# Patient Record
Sex: Female | Born: 1945 | Race: White | Hispanic: No | Marital: Single
Health system: Southern US, Community
[De-identification: ages and names within clinical notes are randomized; demographics above are authoritative.]

---

## 2007-10-31 ENCOUNTER — Encounter: Admission: RE | Admit: 2007-10-31 | Discharge: 2007-10-31 | Payer: Self-pay | Admitting: Unknown Physician Specialty

## 2007-11-03 ENCOUNTER — Encounter: Admission: RE | Admit: 2007-11-03 | Discharge: 2007-11-03 | Payer: Self-pay | Admitting: Unknown Physician Specialty

## 2012-03-10 ENCOUNTER — Ambulatory Visit (INDEPENDENT_AMBULATORY_CARE_PROVIDER_SITE_OTHER): Payer: Medicare Other

## 2012-03-10 ENCOUNTER — Other Ambulatory Visit: Payer: Self-pay | Admitting: Family Medicine

## 2012-03-10 DIAGNOSIS — R109 Unspecified abdominal pain: Secondary | ICD-10-CM

## 2012-03-10 DIAGNOSIS — R197 Diarrhea, unspecified: Secondary | ICD-10-CM

## 2013-05-08 ENCOUNTER — Ambulatory Visit (INDEPENDENT_AMBULATORY_CARE_PROVIDER_SITE_OTHER): Payer: Medicare HMO

## 2013-05-08 ENCOUNTER — Other Ambulatory Visit: Payer: Self-pay | Admitting: Unknown Physician Specialty

## 2013-05-08 DIAGNOSIS — Z1231 Encounter for screening mammogram for malignant neoplasm of breast: Secondary | ICD-10-CM

## 2013-05-08 DIAGNOSIS — R059 Cough, unspecified: Secondary | ICD-10-CM

## 2013-05-08 DIAGNOSIS — R05 Cough: Secondary | ICD-10-CM

## 2013-05-08 DIAGNOSIS — Z780 Asymptomatic menopausal state: Secondary | ICD-10-CM

## 2013-05-08 DIAGNOSIS — M949 Disorder of cartilage, unspecified: Secondary | ICD-10-CM

## 2013-05-08 DIAGNOSIS — M899 Disorder of bone, unspecified: Secondary | ICD-10-CM

## 2013-05-08 DIAGNOSIS — Z139 Encounter for screening, unspecified: Secondary | ICD-10-CM

## 2013-05-08 DIAGNOSIS — Z87891 Personal history of nicotine dependence: Secondary | ICD-10-CM

## 2014-05-06 DIAGNOSIS — Z124 Encounter for screening for malignant neoplasm of cervix: Secondary | ICD-10-CM | POA: Diagnosis not present

## 2014-05-06 DIAGNOSIS — Z01419 Encounter for gynecological examination (general) (routine) without abnormal findings: Secondary | ICD-10-CM | POA: Diagnosis not present

## 2014-05-06 DIAGNOSIS — R87612 Low grade squamous intraepithelial lesion on cytologic smear of cervix (LGSIL): Secondary | ICD-10-CM | POA: Diagnosis not present

## 2014-05-06 DIAGNOSIS — Z1212 Encounter for screening for malignant neoplasm of rectum: Secondary | ICD-10-CM | POA: Diagnosis not present

## 2014-05-27 DIAGNOSIS — Z8679 Personal history of other diseases of the circulatory system: Secondary | ICD-10-CM | POA: Diagnosis not present

## 2014-05-27 DIAGNOSIS — R5383 Other fatigue: Secondary | ICD-10-CM | POA: Diagnosis not present

## 2014-05-27 DIAGNOSIS — I2510 Atherosclerotic heart disease of native coronary artery without angina pectoris: Secondary | ICD-10-CM | POA: Diagnosis not present

## 2014-05-27 DIAGNOSIS — Z23 Encounter for immunization: Secondary | ICD-10-CM | POA: Diagnosis not present

## 2014-05-27 DIAGNOSIS — E119 Type 2 diabetes mellitus without complications: Secondary | ICD-10-CM | POA: Diagnosis not present

## 2014-05-27 DIAGNOSIS — M8580 Other specified disorders of bone density and structure, unspecified site: Secondary | ICD-10-CM | POA: Diagnosis not present

## 2014-05-27 DIAGNOSIS — E780 Pure hypercholesterolemia: Secondary | ICD-10-CM | POA: Diagnosis not present

## 2014-05-27 DIAGNOSIS — M2550 Pain in unspecified joint: Secondary | ICD-10-CM | POA: Diagnosis not present

## 2014-06-05 DIAGNOSIS — I2510 Atherosclerotic heart disease of native coronary artery without angina pectoris: Secondary | ICD-10-CM | POA: Diagnosis not present

## 2014-06-05 DIAGNOSIS — E785 Hyperlipidemia, unspecified: Secondary | ICD-10-CM | POA: Diagnosis not present

## 2014-06-05 DIAGNOSIS — Z955 Presence of coronary angioplasty implant and graft: Secondary | ICD-10-CM | POA: Diagnosis not present

## 2014-06-05 DIAGNOSIS — I252 Old myocardial infarction: Secondary | ICD-10-CM | POA: Diagnosis not present

## 2014-06-05 DIAGNOSIS — I10 Essential (primary) hypertension: Secondary | ICD-10-CM | POA: Diagnosis not present

## 2014-10-03 DIAGNOSIS — I2510 Atherosclerotic heart disease of native coronary artery without angina pectoris: Secondary | ICD-10-CM | POA: Diagnosis not present

## 2014-10-03 DIAGNOSIS — E119 Type 2 diabetes mellitus without complications: Secondary | ICD-10-CM | POA: Diagnosis not present

## 2014-10-03 DIAGNOSIS — K219 Gastro-esophageal reflux disease without esophagitis: Secondary | ICD-10-CM | POA: Diagnosis not present

## 2014-10-03 DIAGNOSIS — E780 Pure hypercholesterolemia: Secondary | ICD-10-CM | POA: Diagnosis not present

## 2014-12-19 DIAGNOSIS — Z23 Encounter for immunization: Secondary | ICD-10-CM | POA: Diagnosis not present

## 2015-01-28 DIAGNOSIS — K219 Gastro-esophageal reflux disease without esophagitis: Secondary | ICD-10-CM | POA: Diagnosis not present

## 2015-01-28 DIAGNOSIS — E7800 Pure hypercholesterolemia, unspecified: Secondary | ICD-10-CM | POA: Diagnosis not present

## 2015-01-28 DIAGNOSIS — M8580 Other specified disorders of bone density and structure, unspecified site: Secondary | ICD-10-CM | POA: Diagnosis not present

## 2015-01-28 DIAGNOSIS — E119 Type 2 diabetes mellitus without complications: Secondary | ICD-10-CM | POA: Diagnosis not present

## 2015-04-01 DIAGNOSIS — E875 Hyperkalemia: Secondary | ICD-10-CM | POA: Diagnosis not present

## 2015-04-29 DIAGNOSIS — H25813 Combined forms of age-related cataract, bilateral: Secondary | ICD-10-CM | POA: Diagnosis not present

## 2015-04-29 DIAGNOSIS — H524 Presbyopia: Secondary | ICD-10-CM | POA: Diagnosis not present

## 2015-04-29 DIAGNOSIS — H5210 Myopia, unspecified eye: Secondary | ICD-10-CM | POA: Diagnosis not present

## 2015-05-08 DIAGNOSIS — Z6833 Body mass index (BMI) 33.0-33.9, adult: Secondary | ICD-10-CM | POA: Diagnosis not present

## 2015-05-08 DIAGNOSIS — Z01419 Encounter for gynecological examination (general) (routine) without abnormal findings: Secondary | ICD-10-CM | POA: Diagnosis not present

## 2015-05-26 DIAGNOSIS — H25813 Combined forms of age-related cataract, bilateral: Secondary | ICD-10-CM | POA: Diagnosis not present

## 2015-05-26 DIAGNOSIS — H02834 Dermatochalasis of left upper eyelid: Secondary | ICD-10-CM | POA: Diagnosis not present

## 2015-05-26 DIAGNOSIS — H43812 Vitreous degeneration, left eye: Secondary | ICD-10-CM | POA: Diagnosis not present

## 2015-05-26 DIAGNOSIS — H35371 Puckering of macula, right eye: Secondary | ICD-10-CM | POA: Diagnosis not present

## 2015-05-26 DIAGNOSIS — H527 Unspecified disorder of refraction: Secondary | ICD-10-CM | POA: Diagnosis not present

## 2015-05-26 DIAGNOSIS — H02831 Dermatochalasis of right upper eyelid: Secondary | ICD-10-CM | POA: Diagnosis not present

## 2015-05-27 DIAGNOSIS — Z0000 Encounter for general adult medical examination without abnormal findings: Secondary | ICD-10-CM | POA: Diagnosis not present

## 2015-05-27 DIAGNOSIS — M8580 Other specified disorders of bone density and structure, unspecified site: Secondary | ICD-10-CM | POA: Diagnosis not present

## 2015-05-27 DIAGNOSIS — I10 Essential (primary) hypertension: Secondary | ICD-10-CM | POA: Diagnosis not present

## 2015-05-27 DIAGNOSIS — I2510 Atherosclerotic heart disease of native coronary artery without angina pectoris: Secondary | ICD-10-CM | POA: Diagnosis not present

## 2015-05-27 DIAGNOSIS — M899 Disorder of bone, unspecified: Secondary | ICD-10-CM | POA: Diagnosis not present

## 2015-05-27 DIAGNOSIS — E119 Type 2 diabetes mellitus without complications: Secondary | ICD-10-CM | POA: Diagnosis not present

## 2015-05-27 DIAGNOSIS — H9190 Unspecified hearing loss, unspecified ear: Secondary | ICD-10-CM | POA: Diagnosis not present

## 2015-05-27 DIAGNOSIS — E7800 Pure hypercholesterolemia, unspecified: Secondary | ICD-10-CM | POA: Diagnosis not present

## 2015-05-27 DIAGNOSIS — N393 Stress incontinence (female) (male): Secondary | ICD-10-CM | POA: Diagnosis not present

## 2015-05-29 ENCOUNTER — Other Ambulatory Visit: Payer: Self-pay | Admitting: Unknown Physician Specialty

## 2015-05-29 DIAGNOSIS — Z0000 Encounter for general adult medical examination without abnormal findings: Secondary | ICD-10-CM

## 2015-06-04 ENCOUNTER — Ambulatory Visit (INDEPENDENT_AMBULATORY_CARE_PROVIDER_SITE_OTHER): Payer: Commercial Managed Care - HMO

## 2015-06-04 DIAGNOSIS — M85859 Other specified disorders of bone density and structure, unspecified thigh: Secondary | ICD-10-CM

## 2015-06-04 DIAGNOSIS — M8589 Other specified disorders of bone density and structure, multiple sites: Secondary | ICD-10-CM | POA: Diagnosis not present

## 2015-06-04 DIAGNOSIS — Z0000 Encounter for general adult medical examination without abnormal findings: Secondary | ICD-10-CM

## 2015-06-27 DIAGNOSIS — H52223 Regular astigmatism, bilateral: Secondary | ICD-10-CM | POA: Diagnosis not present

## 2015-06-27 DIAGNOSIS — H02831 Dermatochalasis of right upper eyelid: Secondary | ICD-10-CM | POA: Diagnosis not present

## 2015-06-27 DIAGNOSIS — H25813 Combined forms of age-related cataract, bilateral: Secondary | ICD-10-CM | POA: Diagnosis not present

## 2015-06-27 DIAGNOSIS — H02834 Dermatochalasis of left upper eyelid: Secondary | ICD-10-CM | POA: Diagnosis not present

## 2015-06-27 DIAGNOSIS — H527 Unspecified disorder of refraction: Secondary | ICD-10-CM | POA: Diagnosis not present

## 2015-06-27 DIAGNOSIS — H43812 Vitreous degeneration, left eye: Secondary | ICD-10-CM | POA: Diagnosis not present

## 2015-06-27 DIAGNOSIS — H35371 Puckering of macula, right eye: Secondary | ICD-10-CM | POA: Diagnosis not present

## 2015-06-28 DIAGNOSIS — E785 Hyperlipidemia, unspecified: Secondary | ICD-10-CM | POA: Diagnosis not present

## 2015-06-28 DIAGNOSIS — S8991XA Unspecified injury of right lower leg, initial encounter: Secondary | ICD-10-CM | POA: Diagnosis not present

## 2015-06-28 DIAGNOSIS — W010XXA Fall on same level from slipping, tripping and stumbling without subsequent striking against object, initial encounter: Secondary | ICD-10-CM | POA: Diagnosis not present

## 2015-06-28 DIAGNOSIS — T148 Other injury of unspecified body region: Secondary | ICD-10-CM | POA: Diagnosis not present

## 2015-06-28 DIAGNOSIS — F17210 Nicotine dependence, cigarettes, uncomplicated: Secondary | ICD-10-CM | POA: Diagnosis not present

## 2015-06-28 DIAGNOSIS — E119 Type 2 diabetes mellitus without complications: Secondary | ICD-10-CM | POA: Diagnosis not present

## 2015-06-28 DIAGNOSIS — S50811A Abrasion of right forearm, initial encounter: Secondary | ICD-10-CM | POA: Diagnosis not present

## 2015-06-28 DIAGNOSIS — S8011XA Contusion of right lower leg, initial encounter: Secondary | ICD-10-CM | POA: Diagnosis not present

## 2015-06-28 DIAGNOSIS — I2510 Atherosclerotic heart disease of native coronary artery without angina pectoris: Secondary | ICD-10-CM | POA: Diagnosis not present

## 2015-06-28 DIAGNOSIS — Z9861 Coronary angioplasty status: Secondary | ICD-10-CM | POA: Diagnosis not present

## 2015-06-28 DIAGNOSIS — Z86718 Personal history of other venous thrombosis and embolism: Secondary | ICD-10-CM | POA: Diagnosis not present

## 2015-06-28 DIAGNOSIS — I252 Old myocardial infarction: Secondary | ICD-10-CM | POA: Diagnosis not present

## 2015-06-28 DIAGNOSIS — M542 Cervicalgia: Secondary | ICD-10-CM | POA: Diagnosis not present

## 2015-07-03 DIAGNOSIS — H25811 Combined forms of age-related cataract, right eye: Secondary | ICD-10-CM | POA: Diagnosis not present

## 2015-07-03 DIAGNOSIS — H02834 Dermatochalasis of left upper eyelid: Secondary | ICD-10-CM | POA: Diagnosis not present

## 2015-07-03 DIAGNOSIS — H2181 Floppy iris syndrome: Secondary | ICD-10-CM | POA: Diagnosis not present

## 2015-07-03 DIAGNOSIS — H35371 Puckering of macula, right eye: Secondary | ICD-10-CM | POA: Diagnosis not present

## 2015-07-03 DIAGNOSIS — E1136 Type 2 diabetes mellitus with diabetic cataract: Secondary | ICD-10-CM | POA: Diagnosis not present

## 2015-07-03 DIAGNOSIS — H43812 Vitreous degeneration, left eye: Secondary | ICD-10-CM | POA: Diagnosis not present

## 2015-07-03 DIAGNOSIS — H25813 Combined forms of age-related cataract, bilateral: Secondary | ICD-10-CM | POA: Diagnosis not present

## 2015-07-03 DIAGNOSIS — H527 Unspecified disorder of refraction: Secondary | ICD-10-CM | POA: Diagnosis not present

## 2015-07-03 DIAGNOSIS — H52223 Regular astigmatism, bilateral: Secondary | ICD-10-CM | POA: Diagnosis not present

## 2015-07-03 DIAGNOSIS — H02831 Dermatochalasis of right upper eyelid: Secondary | ICD-10-CM | POA: Diagnosis not present

## 2015-07-08 DIAGNOSIS — K621 Rectal polyp: Secondary | ICD-10-CM | POA: Diagnosis not present

## 2015-07-08 DIAGNOSIS — Z1211 Encounter for screening for malignant neoplasm of colon: Secondary | ICD-10-CM | POA: Diagnosis not present

## 2015-07-08 DIAGNOSIS — D125 Benign neoplasm of sigmoid colon: Secondary | ICD-10-CM | POA: Diagnosis not present

## 2015-07-16 DIAGNOSIS — J069 Acute upper respiratory infection, unspecified: Secondary | ICD-10-CM | POA: Diagnosis not present

## 2015-07-16 DIAGNOSIS — R0789 Other chest pain: Secondary | ICD-10-CM | POA: Diagnosis not present

## 2015-07-16 DIAGNOSIS — J9801 Acute bronchospasm: Secondary | ICD-10-CM | POA: Diagnosis not present

## 2015-07-16 DIAGNOSIS — R0989 Other specified symptoms and signs involving the circulatory and respiratory systems: Secondary | ICD-10-CM | POA: Diagnosis not present

## 2015-07-17 DIAGNOSIS — I252 Old myocardial infarction: Secondary | ICD-10-CM | POA: Diagnosis not present

## 2015-07-17 DIAGNOSIS — Z720 Tobacco use: Secondary | ICD-10-CM | POA: Diagnosis not present

## 2015-07-17 DIAGNOSIS — I10 Essential (primary) hypertension: Secondary | ICD-10-CM | POA: Diagnosis not present

## 2015-07-17 DIAGNOSIS — E782 Mixed hyperlipidemia: Secondary | ICD-10-CM | POA: Diagnosis not present

## 2015-09-04 DIAGNOSIS — E785 Hyperlipidemia, unspecified: Secondary | ICD-10-CM | POA: Diagnosis not present

## 2015-09-04 DIAGNOSIS — H52222 Regular astigmatism, left eye: Secondary | ICD-10-CM | POA: Diagnosis not present

## 2015-09-04 DIAGNOSIS — I252 Old myocardial infarction: Secondary | ICD-10-CM | POA: Diagnosis not present

## 2015-09-04 DIAGNOSIS — K219 Gastro-esophageal reflux disease without esophagitis: Secondary | ICD-10-CM | POA: Diagnosis not present

## 2015-09-04 DIAGNOSIS — Z955 Presence of coronary angioplasty implant and graft: Secondary | ICD-10-CM | POA: Diagnosis not present

## 2015-09-04 DIAGNOSIS — I2510 Atherosclerotic heart disease of native coronary artery without angina pectoris: Secondary | ICD-10-CM | POA: Diagnosis not present

## 2015-09-04 DIAGNOSIS — H25811 Combined forms of age-related cataract, right eye: Secondary | ICD-10-CM | POA: Diagnosis not present

## 2015-09-04 DIAGNOSIS — E1136 Type 2 diabetes mellitus with diabetic cataract: Secondary | ICD-10-CM | POA: Diagnosis not present

## 2015-09-04 DIAGNOSIS — H25812 Combined forms of age-related cataract, left eye: Secondary | ICD-10-CM | POA: Diagnosis not present

## 2015-09-04 DIAGNOSIS — E669 Obesity, unspecified: Secondary | ICD-10-CM | POA: Diagnosis not present

## 2015-09-24 DIAGNOSIS — M25561 Pain in right knee: Secondary | ICD-10-CM | POA: Diagnosis not present

## 2015-09-24 DIAGNOSIS — M170 Bilateral primary osteoarthritis of knee: Secondary | ICD-10-CM | POA: Diagnosis not present

## 2015-09-24 DIAGNOSIS — M25562 Pain in left knee: Secondary | ICD-10-CM | POA: Diagnosis not present

## 2015-10-09 DIAGNOSIS — E119 Type 2 diabetes mellitus without complications: Secondary | ICD-10-CM | POA: Diagnosis not present

## 2015-10-09 DIAGNOSIS — E7800 Pure hypercholesterolemia, unspecified: Secondary | ICD-10-CM | POA: Diagnosis not present

## 2015-10-09 DIAGNOSIS — M8580 Other specified disorders of bone density and structure, unspecified site: Secondary | ICD-10-CM | POA: Diagnosis not present

## 2015-10-09 DIAGNOSIS — K219 Gastro-esophageal reflux disease without esophagitis: Secondary | ICD-10-CM | POA: Diagnosis not present

## 2015-10-22 DIAGNOSIS — M25562 Pain in left knee: Secondary | ICD-10-CM | POA: Diagnosis not present

## 2015-10-22 DIAGNOSIS — M25561 Pain in right knee: Secondary | ICD-10-CM | POA: Diagnosis not present

## 2015-10-22 DIAGNOSIS — M170 Bilateral primary osteoarthritis of knee: Secondary | ICD-10-CM | POA: Diagnosis not present

## 2015-11-17 DIAGNOSIS — Z23 Encounter for immunization: Secondary | ICD-10-CM | POA: Diagnosis not present

## 2016-02-13 DIAGNOSIS — M170 Bilateral primary osteoarthritis of knee: Secondary | ICD-10-CM | POA: Diagnosis not present

## 2016-02-13 DIAGNOSIS — M25561 Pain in right knee: Secondary | ICD-10-CM | POA: Diagnosis not present

## 2016-02-13 DIAGNOSIS — M25562 Pain in left knee: Secondary | ICD-10-CM | POA: Diagnosis not present

## 2016-02-19 DIAGNOSIS — E7800 Pure hypercholesterolemia, unspecified: Secondary | ICD-10-CM | POA: Diagnosis not present

## 2016-02-19 DIAGNOSIS — K219 Gastro-esophageal reflux disease without esophagitis: Secondary | ICD-10-CM | POA: Diagnosis not present

## 2016-02-19 DIAGNOSIS — E119 Type 2 diabetes mellitus without complications: Secondary | ICD-10-CM | POA: Diagnosis not present

## 2016-02-19 DIAGNOSIS — M170 Bilateral primary osteoarthritis of knee: Secondary | ICD-10-CM | POA: Diagnosis not present

## 2016-03-16 DIAGNOSIS — J9801 Acute bronchospasm: Secondary | ICD-10-CM | POA: Diagnosis not present

## 2016-03-16 DIAGNOSIS — J069 Acute upper respiratory infection, unspecified: Secondary | ICD-10-CM | POA: Diagnosis not present

## 2016-05-12 DIAGNOSIS — M25561 Pain in right knee: Secondary | ICD-10-CM | POA: Diagnosis not present

## 2016-05-12 DIAGNOSIS — M25562 Pain in left knee: Secondary | ICD-10-CM | POA: Diagnosis not present

## 2016-06-18 DIAGNOSIS — H5210 Myopia, unspecified eye: Secondary | ICD-10-CM | POA: Diagnosis not present

## 2016-06-18 DIAGNOSIS — E109 Type 1 diabetes mellitus without complications: Secondary | ICD-10-CM | POA: Diagnosis not present

## 2016-06-18 DIAGNOSIS — E7800 Pure hypercholesterolemia, unspecified: Secondary | ICD-10-CM | POA: Diagnosis not present

## 2016-06-28 DIAGNOSIS — I2510 Atherosclerotic heart disease of native coronary artery without angina pectoris: Secondary | ICD-10-CM | POA: Diagnosis not present

## 2016-06-28 DIAGNOSIS — E7800 Pure hypercholesterolemia, unspecified: Secondary | ICD-10-CM | POA: Diagnosis not present

## 2016-06-28 DIAGNOSIS — Z0000 Encounter for general adult medical examination without abnormal findings: Secondary | ICD-10-CM | POA: Diagnosis not present

## 2016-06-28 DIAGNOSIS — F339 Major depressive disorder, recurrent, unspecified: Secondary | ICD-10-CM | POA: Diagnosis not present

## 2016-06-28 DIAGNOSIS — E119 Type 2 diabetes mellitus without complications: Secondary | ICD-10-CM | POA: Diagnosis not present

## 2016-06-29 DIAGNOSIS — R635 Abnormal weight gain: Secondary | ICD-10-CM | POA: Diagnosis not present

## 2016-06-29 DIAGNOSIS — E7800 Pure hypercholesterolemia, unspecified: Secondary | ICD-10-CM | POA: Diagnosis not present

## 2016-06-29 DIAGNOSIS — E119 Type 2 diabetes mellitus without complications: Secondary | ICD-10-CM | POA: Diagnosis not present

## 2016-06-29 DIAGNOSIS — M8580 Other specified disorders of bone density and structure, unspecified site: Secondary | ICD-10-CM | POA: Diagnosis not present

## 2016-06-29 DIAGNOSIS — R5383 Other fatigue: Secondary | ICD-10-CM | POA: Diagnosis not present

## 2016-06-29 DIAGNOSIS — M899 Disorder of bone, unspecified: Secondary | ICD-10-CM | POA: Diagnosis not present

## 2016-07-19 DIAGNOSIS — I252 Old myocardial infarction: Secondary | ICD-10-CM | POA: Diagnosis not present

## 2016-07-19 DIAGNOSIS — I2510 Atherosclerotic heart disease of native coronary artery without angina pectoris: Secondary | ICD-10-CM | POA: Diagnosis not present

## 2016-07-19 DIAGNOSIS — E785 Hyperlipidemia, unspecified: Secondary | ICD-10-CM | POA: Diagnosis not present

## 2016-07-19 DIAGNOSIS — I10 Essential (primary) hypertension: Secondary | ICD-10-CM | POA: Diagnosis not present

## 2016-07-19 DIAGNOSIS — Z955 Presence of coronary angioplasty implant and graft: Secondary | ICD-10-CM | POA: Diagnosis not present

## 2016-07-20 DIAGNOSIS — L719 Rosacea, unspecified: Secondary | ICD-10-CM | POA: Diagnosis not present

## 2016-07-30 DIAGNOSIS — M25562 Pain in left knee: Secondary | ICD-10-CM | POA: Diagnosis not present

## 2016-07-30 DIAGNOSIS — M25561 Pain in right knee: Secondary | ICD-10-CM | POA: Diagnosis not present

## 2016-08-26 DIAGNOSIS — E119 Type 2 diabetes mellitus without complications: Secondary | ICD-10-CM | POA: Diagnosis not present

## 2016-08-26 DIAGNOSIS — R5383 Other fatigue: Secondary | ICD-10-CM | POA: Diagnosis not present

## 2016-08-26 DIAGNOSIS — I252 Old myocardial infarction: Secondary | ICD-10-CM | POA: Diagnosis not present

## 2016-08-26 DIAGNOSIS — I2510 Atherosclerotic heart disease of native coronary artery without angina pectoris: Secondary | ICD-10-CM | POA: Diagnosis not present

## 2016-08-31 DIAGNOSIS — L719 Rosacea, unspecified: Secondary | ICD-10-CM | POA: Diagnosis not present

## 2016-11-02 DIAGNOSIS — E785 Hyperlipidemia, unspecified: Secondary | ICD-10-CM | POA: Diagnosis not present

## 2016-11-02 DIAGNOSIS — I2510 Atherosclerotic heart disease of native coronary artery without angina pectoris: Secondary | ICD-10-CM | POA: Diagnosis not present

## 2016-11-02 DIAGNOSIS — E119 Type 2 diabetes mellitus without complications: Secondary | ICD-10-CM | POA: Diagnosis not present

## 2016-11-02 DIAGNOSIS — Z23 Encounter for immunization: Secondary | ICD-10-CM | POA: Diagnosis not present

## 2016-11-02 DIAGNOSIS — K219 Gastro-esophageal reflux disease without esophagitis: Secondary | ICD-10-CM | POA: Diagnosis not present

## 2016-12-20 DIAGNOSIS — E119 Type 2 diabetes mellitus without complications: Secondary | ICD-10-CM | POA: Diagnosis not present

## 2016-12-20 DIAGNOSIS — R209 Unspecified disturbances of skin sensation: Secondary | ICD-10-CM | POA: Diagnosis not present

## 2016-12-20 DIAGNOSIS — G5603 Carpal tunnel syndrome, bilateral upper limbs: Secondary | ICD-10-CM | POA: Diagnosis not present

## 2016-12-20 DIAGNOSIS — M25512 Pain in left shoulder: Secondary | ICD-10-CM | POA: Diagnosis not present

## 2017-03-03 DIAGNOSIS — E119 Type 2 diabetes mellitus without complications: Secondary | ICD-10-CM | POA: Diagnosis not present

## 2017-03-03 DIAGNOSIS — F339 Major depressive disorder, recurrent, unspecified: Secondary | ICD-10-CM | POA: Diagnosis not present

## 2017-03-03 DIAGNOSIS — E785 Hyperlipidemia, unspecified: Secondary | ICD-10-CM | POA: Diagnosis not present

## 2017-03-03 DIAGNOSIS — K219 Gastro-esophageal reflux disease without esophagitis: Secondary | ICD-10-CM | POA: Diagnosis not present

## 2017-03-09 DIAGNOSIS — M25562 Pain in left knee: Secondary | ICD-10-CM | POA: Diagnosis not present

## 2017-03-09 DIAGNOSIS — M25561 Pain in right knee: Secondary | ICD-10-CM | POA: Diagnosis not present

## 2017-03-09 DIAGNOSIS — M170 Bilateral primary osteoarthritis of knee: Secondary | ICD-10-CM | POA: Diagnosis not present

## 2017-03-17 DIAGNOSIS — Z01419 Encounter for gynecological examination (general) (routine) without abnormal findings: Secondary | ICD-10-CM | POA: Diagnosis not present

## 2017-03-17 DIAGNOSIS — Z87410 Personal history of cervical dysplasia: Secondary | ICD-10-CM | POA: Diagnosis not present

## 2017-03-24 DIAGNOSIS — R1900 Intra-abdominal and pelvic swelling, mass and lump, unspecified site: Secondary | ICD-10-CM | POA: Diagnosis not present

## 2017-05-06 ENCOUNTER — Other Ambulatory Visit: Payer: Self-pay | Admitting: Unknown Physician Specialty

## 2017-05-06 DIAGNOSIS — Z1239 Encounter for other screening for malignant neoplasm of breast: Secondary | ICD-10-CM

## 2017-05-11 ENCOUNTER — Ambulatory Visit: Payer: Self-pay

## 2017-05-12 ENCOUNTER — Ambulatory Visit (INDEPENDENT_AMBULATORY_CARE_PROVIDER_SITE_OTHER): Payer: Medicare HMO

## 2017-05-12 DIAGNOSIS — Z1231 Encounter for screening mammogram for malignant neoplasm of breast: Secondary | ICD-10-CM | POA: Diagnosis not present

## 2017-05-12 DIAGNOSIS — Z1239 Encounter for other screening for malignant neoplasm of breast: Secondary | ICD-10-CM

## 2017-06-10 DIAGNOSIS — E118 Type 2 diabetes mellitus with unspecified complications: Secondary | ICD-10-CM | POA: Diagnosis not present

## 2017-06-10 DIAGNOSIS — G4489 Other headache syndrome: Secondary | ICD-10-CM | POA: Diagnosis not present

## 2017-06-10 DIAGNOSIS — F17210 Nicotine dependence, cigarettes, uncomplicated: Secondary | ICD-10-CM | POA: Diagnosis not present

## 2017-06-10 DIAGNOSIS — R404 Transient alteration of awareness: Secondary | ICD-10-CM | POA: Diagnosis not present

## 2017-06-10 DIAGNOSIS — R5383 Other fatigue: Secondary | ICD-10-CM | POA: Diagnosis not present

## 2017-06-10 DIAGNOSIS — Z955 Presence of coronary angioplasty implant and graft: Secondary | ICD-10-CM | POA: Diagnosis not present

## 2017-06-10 DIAGNOSIS — R0789 Other chest pain: Secondary | ICD-10-CM | POA: Diagnosis not present

## 2017-06-10 DIAGNOSIS — I2510 Atherosclerotic heart disease of native coronary artery without angina pectoris: Secondary | ICD-10-CM | POA: Diagnosis not present

## 2017-06-10 DIAGNOSIS — E119 Type 2 diabetes mellitus without complications: Secondary | ICD-10-CM | POA: Diagnosis not present

## 2017-06-10 DIAGNOSIS — B349 Viral infection, unspecified: Secondary | ICD-10-CM | POA: Diagnosis not present

## 2017-06-10 DIAGNOSIS — R51 Headache: Secondary | ICD-10-CM | POA: Diagnosis not present

## 2017-06-10 DIAGNOSIS — R079 Chest pain, unspecified: Secondary | ICD-10-CM | POA: Diagnosis not present

## 2017-06-10 DIAGNOSIS — R531 Weakness: Secondary | ICD-10-CM | POA: Diagnosis not present

## 2017-06-10 DIAGNOSIS — R748 Abnormal levels of other serum enzymes: Secondary | ICD-10-CM | POA: Diagnosis not present

## 2017-06-10 DIAGNOSIS — I10 Essential (primary) hypertension: Secondary | ICD-10-CM | POA: Diagnosis not present

## 2017-06-11 DIAGNOSIS — I2510 Atherosclerotic heart disease of native coronary artery without angina pectoris: Secondary | ICD-10-CM | POA: Diagnosis not present

## 2017-06-11 DIAGNOSIS — R748 Abnormal levels of other serum enzymes: Secondary | ICD-10-CM | POA: Diagnosis not present

## 2017-06-11 DIAGNOSIS — B349 Viral infection, unspecified: Secondary | ICD-10-CM | POA: Diagnosis not present

## 2017-06-11 DIAGNOSIS — R51 Headache: Secondary | ICD-10-CM | POA: Diagnosis not present

## 2017-06-29 ENCOUNTER — Ambulatory Visit (INDEPENDENT_AMBULATORY_CARE_PROVIDER_SITE_OTHER): Payer: Medicare HMO

## 2017-06-29 ENCOUNTER — Other Ambulatory Visit: Payer: Self-pay | Admitting: Unknown Physician Specialty

## 2017-06-29 DIAGNOSIS — F339 Major depressive disorder, recurrent, unspecified: Secondary | ICD-10-CM | POA: Diagnosis not present

## 2017-06-29 DIAGNOSIS — R197 Diarrhea, unspecified: Secondary | ICD-10-CM

## 2017-06-29 DIAGNOSIS — R109 Unspecified abdominal pain: Secondary | ICD-10-CM | POA: Diagnosis not present

## 2017-06-29 DIAGNOSIS — R5383 Other fatigue: Secondary | ICD-10-CM | POA: Diagnosis not present

## 2017-06-29 DIAGNOSIS — E119 Type 2 diabetes mellitus without complications: Secondary | ICD-10-CM | POA: Diagnosis not present

## 2017-06-29 DIAGNOSIS — E785 Hyperlipidemia, unspecified: Secondary | ICD-10-CM | POA: Diagnosis not present

## 2017-06-29 DIAGNOSIS — M8580 Other specified disorders of bone density and structure, unspecified site: Secondary | ICD-10-CM | POA: Diagnosis not present

## 2017-06-29 DIAGNOSIS — Z0000 Encounter for general adult medical examination without abnormal findings: Secondary | ICD-10-CM | POA: Diagnosis not present

## 2017-06-29 DIAGNOSIS — I2510 Atherosclerotic heart disease of native coronary artery without angina pectoris: Secondary | ICD-10-CM | POA: Diagnosis not present

## 2017-06-29 DIAGNOSIS — M899 Disorder of bone, unspecified: Secondary | ICD-10-CM | POA: Diagnosis not present

## 2017-07-05 ENCOUNTER — Other Ambulatory Visit: Payer: Self-pay | Admitting: Unknown Physician Specialty

## 2017-07-05 DIAGNOSIS — Z780 Asymptomatic menopausal state: Secondary | ICD-10-CM

## 2017-07-29 DIAGNOSIS — R1030 Lower abdominal pain, unspecified: Secondary | ICD-10-CM | POA: Diagnosis not present

## 2017-07-29 DIAGNOSIS — R197 Diarrhea, unspecified: Secondary | ICD-10-CM | POA: Diagnosis not present

## 2017-11-07 DIAGNOSIS — M8580 Other specified disorders of bone density and structure, unspecified site: Secondary | ICD-10-CM | POA: Diagnosis not present

## 2017-11-07 DIAGNOSIS — E785 Hyperlipidemia, unspecified: Secondary | ICD-10-CM | POA: Diagnosis not present

## 2017-11-07 DIAGNOSIS — E1169 Type 2 diabetes mellitus with other specified complication: Secondary | ICD-10-CM | POA: Diagnosis not present

## 2017-11-07 DIAGNOSIS — Z23 Encounter for immunization: Secondary | ICD-10-CM | POA: Diagnosis not present

## 2017-11-07 DIAGNOSIS — F339 Major depressive disorder, recurrent, unspecified: Secondary | ICD-10-CM | POA: Diagnosis not present

## 2017-11-09 DIAGNOSIS — R1084 Generalized abdominal pain: Secondary | ICD-10-CM | POA: Diagnosis not present

## 2017-11-09 DIAGNOSIS — M1712 Unilateral primary osteoarthritis, left knee: Secondary | ICD-10-CM | POA: Diagnosis not present

## 2017-11-09 DIAGNOSIS — R110 Nausea: Secondary | ICD-10-CM | POA: Diagnosis not present

## 2017-11-09 DIAGNOSIS — M1711 Unilateral primary osteoarthritis, right knee: Secondary | ICD-10-CM | POA: Diagnosis not present

## 2017-11-09 DIAGNOSIS — M25561 Pain in right knee: Secondary | ICD-10-CM | POA: Diagnosis not present

## 2017-11-09 DIAGNOSIS — M25562 Pain in left knee: Secondary | ICD-10-CM | POA: Diagnosis not present

## 2017-11-09 DIAGNOSIS — R140 Abdominal distension (gaseous): Secondary | ICD-10-CM | POA: Diagnosis not present

## 2017-11-10 DIAGNOSIS — I878 Other specified disorders of veins: Secondary | ICD-10-CM | POA: Diagnosis not present

## 2017-11-10 DIAGNOSIS — R140 Abdominal distension (gaseous): Secondary | ICD-10-CM | POA: Diagnosis not present

## 2017-11-10 DIAGNOSIS — K5900 Constipation, unspecified: Secondary | ICD-10-CM | POA: Diagnosis not present

## 2017-11-10 DIAGNOSIS — R109 Unspecified abdominal pain: Secondary | ICD-10-CM | POA: Diagnosis not present

## 2018-01-05 DIAGNOSIS — R140 Abdominal distension (gaseous): Secondary | ICD-10-CM | POA: Diagnosis not present

## 2018-01-05 DIAGNOSIS — R197 Diarrhea, unspecified: Secondary | ICD-10-CM | POA: Diagnosis not present

## 2018-08-30 ENCOUNTER — Other Ambulatory Visit: Payer: Self-pay | Admitting: Unknown Physician Specialty

## 2018-08-30 DIAGNOSIS — Z1231 Encounter for screening mammogram for malignant neoplasm of breast: Secondary | ICD-10-CM

## 2018-10-11 ENCOUNTER — Ambulatory Visit: Payer: Medicare HMO

## 2018-10-25 ENCOUNTER — Ambulatory Visit (INDEPENDENT_AMBULATORY_CARE_PROVIDER_SITE_OTHER): Payer: Medicare Other

## 2018-10-25 ENCOUNTER — Other Ambulatory Visit: Payer: Self-pay

## 2018-10-25 DIAGNOSIS — Z1231 Encounter for screening mammogram for malignant neoplasm of breast: Secondary | ICD-10-CM | POA: Diagnosis not present

## 2019-11-25 IMAGING — DX DG ABDOMEN 2V
3 series · 3 of 3 positions shown · non-contrast
Comparison: None.

ADDENDUM:
This addendum is given for the purpose of noting patient has a
moderate volume of stool in the ascending colon. Stool burden is
otherwise unremarkable.
CLINICAL DATA: Diarrhea for 2 weeks.

EXAM:
ABDOMEN - 2 VIEW

[abdomen erect]
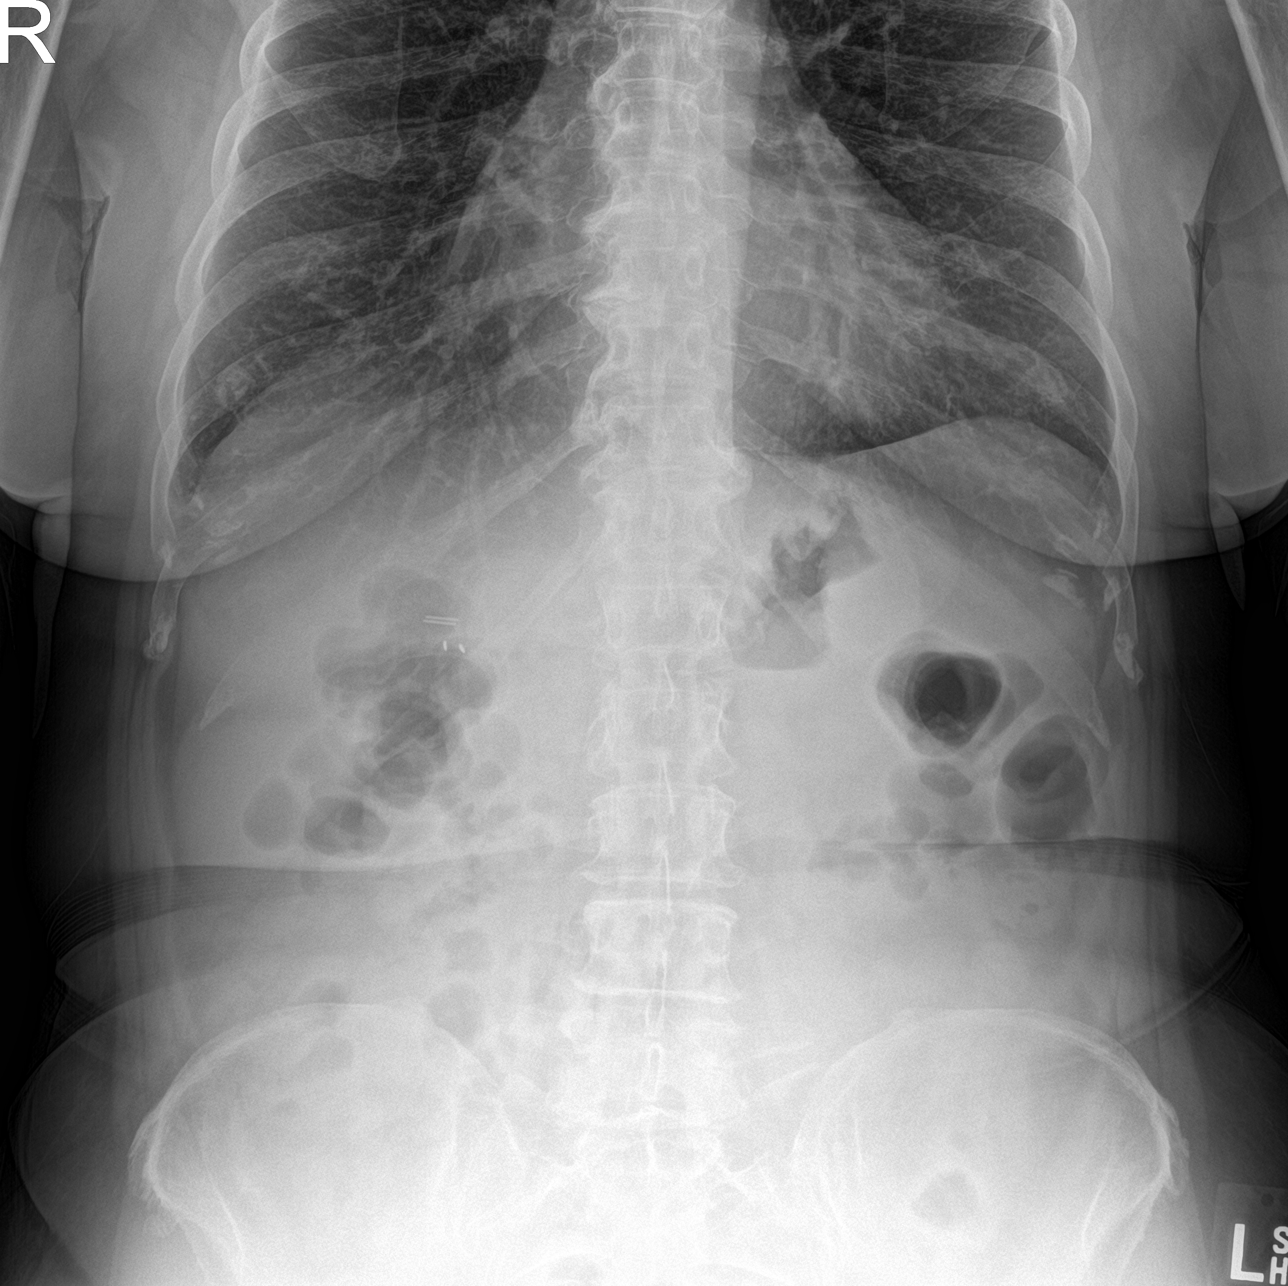

[abdomen supine (1 of 2)]
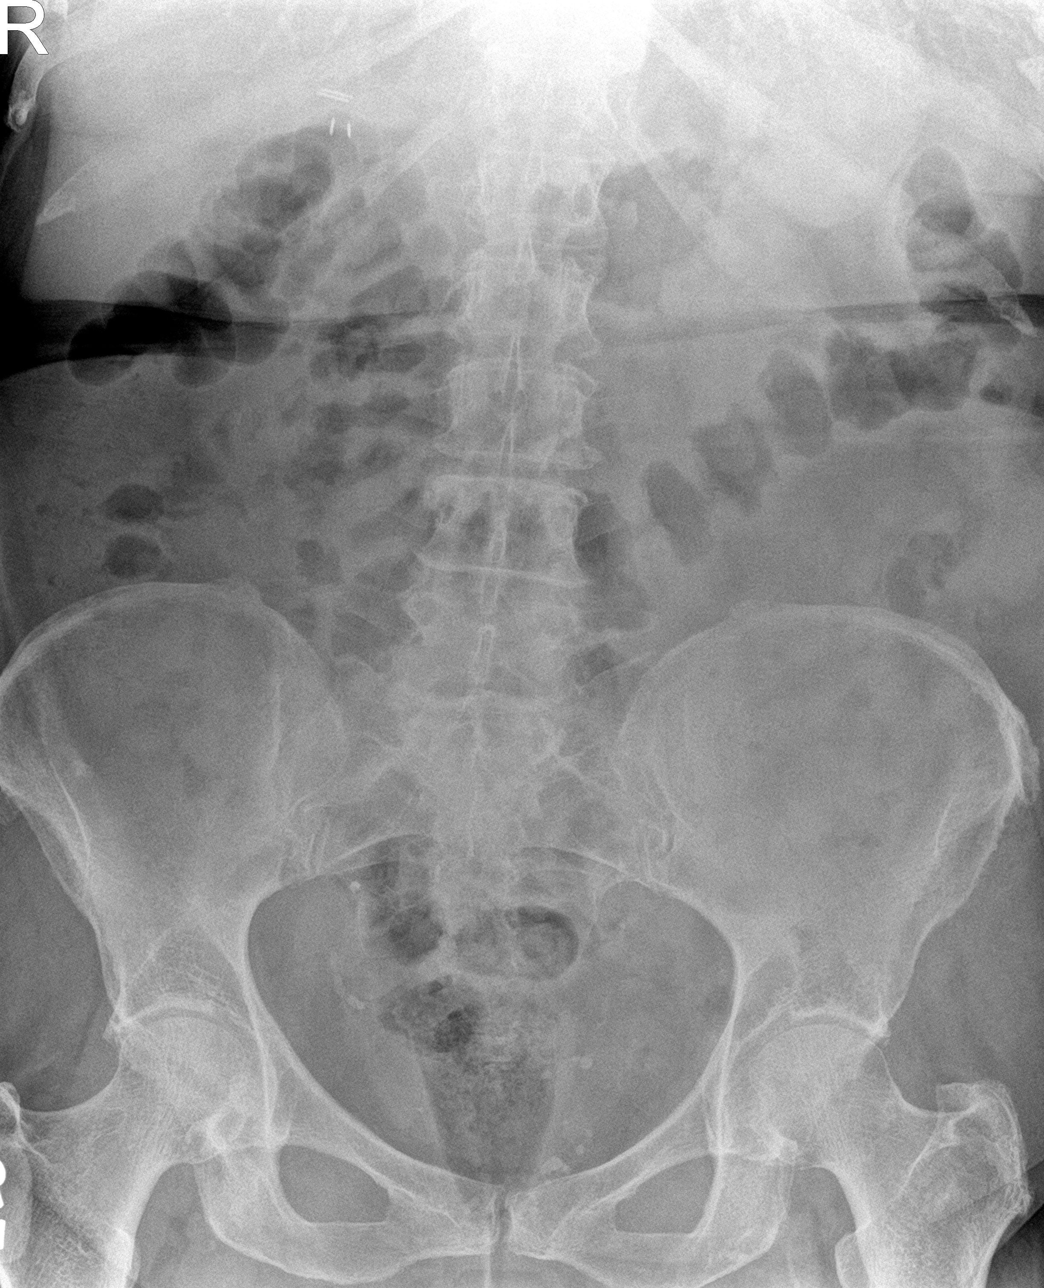

[abdomen supine (2 of 2)]
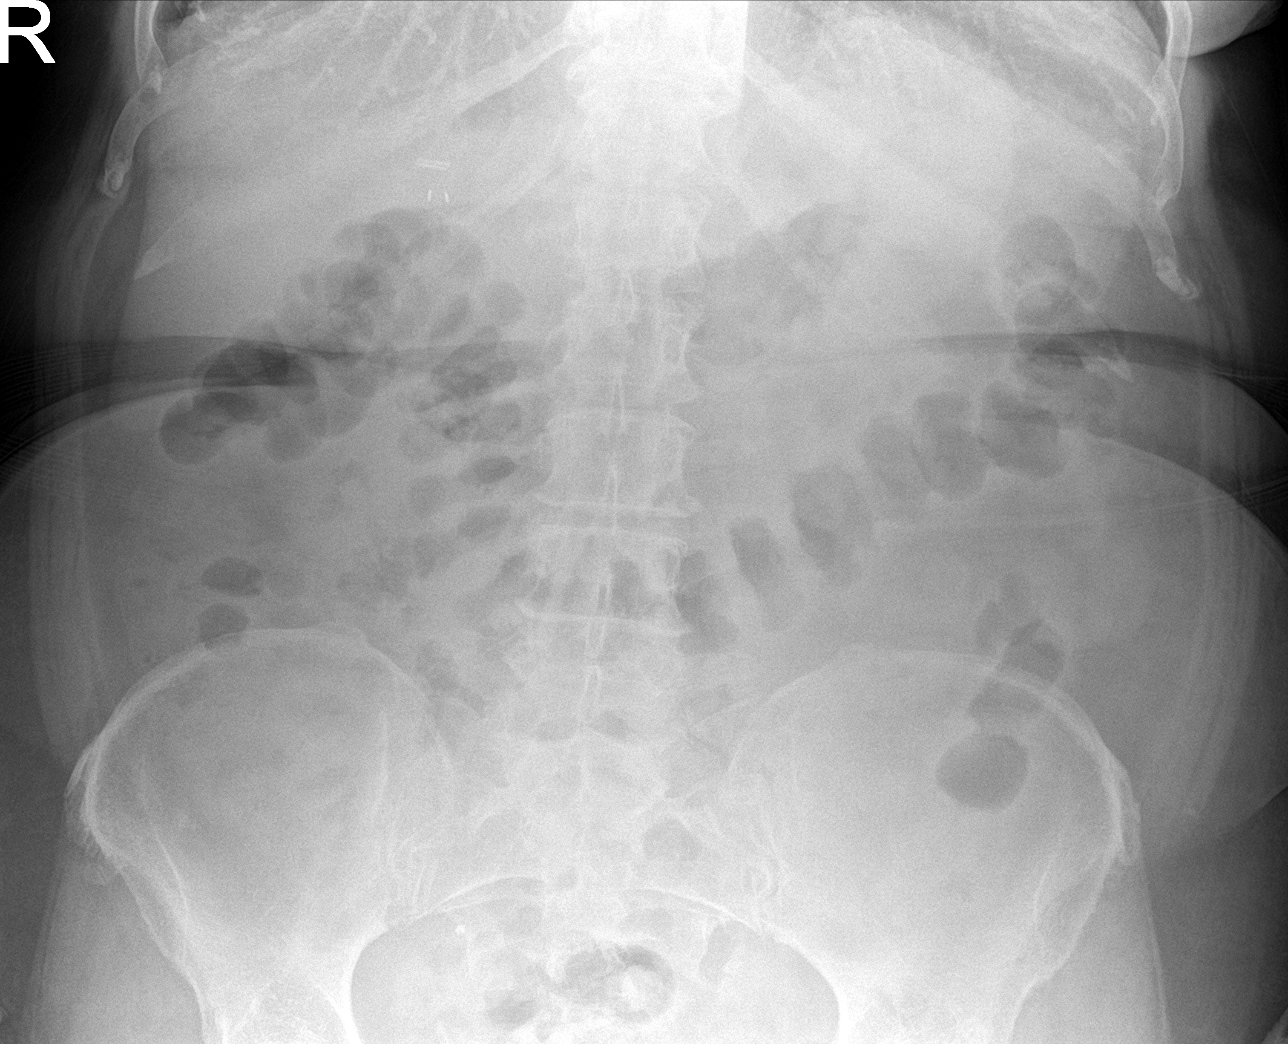

[3 of 3 positions shown; findings below may reference images not displayed]

FINDINGS: The bowel gas pattern is normal. There is no evidence of free air.
No radio-opaque calculi or other significant radiographic
abnormality is seen.
IMPRESSION: Negative exam.

## 2020-01-01 ENCOUNTER — Other Ambulatory Visit: Payer: Self-pay | Admitting: Unknown Physician Specialty

## 2020-01-01 DIAGNOSIS — Z1231 Encounter for screening mammogram for malignant neoplasm of breast: Secondary | ICD-10-CM

## 2020-02-28 ENCOUNTER — Ambulatory Visit (INDEPENDENT_AMBULATORY_CARE_PROVIDER_SITE_OTHER): Payer: Medicare Other

## 2020-02-28 ENCOUNTER — Other Ambulatory Visit: Payer: Self-pay

## 2020-02-28 DIAGNOSIS — Z1231 Encounter for screening mammogram for malignant neoplasm of breast: Secondary | ICD-10-CM
